# Patient Record
Sex: Female | Born: 1990 | Race: Black or African American | Hispanic: No | Marital: Single | Smoking: Never smoker
Health system: Southern US, Community
[De-identification: ages and names within clinical notes are randomized; demographics above are authoritative.]

---

## 2016-04-29 ENCOUNTER — Encounter (HOSPITAL_COMMUNITY): Payer: Self-pay

## 2016-04-29 ENCOUNTER — Emergency Department (HOSPITAL_COMMUNITY)
Admission: EM | Admit: 2016-04-29 | Discharge: 2016-04-30 | Disposition: A | Discharge status: Home / Self Care | Payer: BLUE CROSS/BLUE SHIELD | Attending: Emergency Medicine | Admitting: Emergency Medicine

## 2016-04-29 DIAGNOSIS — L02215 Cutaneous abscess of perineum: Secondary | ICD-10-CM | POA: Diagnosis present

## 2016-04-29 DIAGNOSIS — L0291 Cutaneous abscess, unspecified: Secondary | ICD-10-CM

## 2016-04-29 NOTE — ED Triage Notes (Signed)
Pt presents with an abscess on her R buttock near her anus. Denies discharge, fevers, or N/V.

## 2016-04-29 NOTE — ED Notes (Signed)
PA at bedside.

## 2016-04-29 NOTE — ED Provider Notes (Signed)
[facility]-EMERGENCY DEPT Provider Note   CSN: [id number] Arrival date & time: 04/29/16  2135 By signing my name below, I, Robin Hanson, attest that this documentation has been prepared under the direction and in the presence of non-physician practitioner, Arvilla MeresAshley Dayzha Pogosyan, PA-C  Electronically Signed: Levon HedgerElizabeth Hanson, Scribe. 04/30/2016. 12:03 AM.   History   Chief Complaint Chief Complaint  Patient presents with  . Abscess   HPI Robin Hanson is a 25 y.o. female who presents to the Emergency Department complaining of a worsening  lesion on her right buttock near her anus x 2 days. She states it is moderately painful and is increasing in size. Pain is exacerbated by movement and sitting. No alleviating factors noted. She states she has had a similar abscess before which was incised and drained in the ED.  No hx of chron's, DM, or autoimmune diseases. Pt denies any discharge or bleeding from the area,  fever, nausea, vomiting, abdominal pain, or arthralgias. Pt has no other complaints at this time.   The history is provided by the patient. No language interpreter was used.    History reviewed. No pertinent past medical history.  There are no active problems to display for this patient.   No past surgical history on file.  OB History    No data available      Home Medications    Prior to Admission medications   Medication Sig Start Date End Date Taking? Authorizing Provider  doxycycline (VIBRAMYCIN) 100 MG capsule Take 1 capsule (100 mg total) by mouth 2 (two) times daily. 04/30/16 05/05/16  Lona KettleAshley Laurel Dimitris Shanahan, PA-C   Family History No family history on file.  Social History Social History  Substance Use Topics  . Smoking status: Never Smoker  . Smokeless tobacco: Never Used  . Alcohol use Not on file     Allergies   Penicillins  Review of Systems Review of Systems  Constitutional: Negative for fever.  HENT: Negative for trouble swallowing.   Eyes: Negative for visual  disturbance.  Respiratory: Negative for shortness of breath.   Cardiovascular: Negative for chest pain.  Gastrointestinal: Negative for abdominal pain, nausea and vomiting.  Genitourinary: Negative for dysuria and hematuria.  Musculoskeletal: Negative for arthralgias and myalgias.  Skin: Positive for wound.  Allergic/Immunologic: Negative for immunocompromised state.  Neurological: Negative for headaches.   Physical Exam Updated Vital Signs BP 112/57 (BP Location: Right Arm)   Pulse 64   Temp 99 F (37.2 C) (Rectal)   Resp 14   Ht 5\' 3"  (1.6 m)   Wt 83.9 kg   SpO2 100%   BMI 32.77 kg/m   Physical Exam  Constitutional: She appears well-developed and well-nourished. No distress.  HENT:  Head: Normocephalic and atraumatic.  Mouth/Throat: Oropharynx is clear and moist. No oropharyngeal exudate.  Eyes: Conjunctivae and EOM are normal. Pupils are equal, round, and reactive to light. Right eye exhibits no discharge. Left eye exhibits no discharge. No scleral icterus.  Neck: Normal range of motion and phonation normal. Neck supple. No neck rigidity. Normal range of motion present.  Cardiovascular: Normal rate, regular rhythm, normal heart sounds and intact distal pulses.   No murmur heard. Pulmonary/Chest: Effort normal and breath sounds normal. No stridor. No respiratory distress. She has no wheezes. She has no rales.  Abdominal: Soft. Bowel sounds are normal. She exhibits no distension. There is no tenderness. There is no rigidity, no rebound, no guarding and no CVA tenderness.  Genitourinary:     Musculoskeletal: Normal range  of motion.  Lymphadenopathy:    She has no cervical adenopathy.  Neurological: She is alert. She is not disoriented. Coordination and gait normal. GCS eye subscore is 4. GCS verbal subscore is 5. GCS motor subscore is 6.  Skin: Skin is warm and dry. She is not diaphoretic.  Psychiatric: She has a normal mood and affect. Her behavior is normal.    ED  Treatments / Results  DIAGNOSTIC STUDIES:  Oxygen Saturation is 99% on RA, normal by my interpretation.    COORDINATION OF CARE:  11:55 PM Discussed treatment plan with pt at bedside and pt agreed to plan.   Labs (all labs ordered are listed, but only abnormal results are displayed) Labs Reviewed  I-STAT CHEM 8, ED - Abnormal; Notable for the following:       Result Value   Calcium, Ion 1.14 (*)    All other components within normal limits  CBC WITH DIFFERENTIAL/PLATELET  I-STAT BETA HCG BLOOD, ED ([facility], [facility], [facility] ONLY)    EKG  EKG Interpretation None      Radiology Ct Pelvis W Contrast  Result Date: 04/30/2016 CLINICAL DATA:  25 y/o F; worsening right buttocks lesion near the anus. History of prior abscess drainage. EXAM: CT PELVIS WITH CONTRAST TECHNIQUE: Multidetector CT imaging of the pelvis was performed using the standard protocol following the bolus administration of intravenous contrast. CONTRAST:  100 cc Isovue-300. COMPARISON:  None. FINDINGS: Urinary Tract:  No abnormality visualized. Bowel:  Unremarkable visualized pelvic bowel loops. Vascular/Lymphatic: No pathologically enlarged lymph nodes. No significant vascular abnormality seen. Reproductive: Normal uterus. Normal adnexa. 12 mm right para labial cyst, likely Bartholin. Other:  None. Musculoskeletal: Small fluid collection subjacent to the dermis measuring 15 x 9 x 20 mm (AP x ML x CC) centered in the right gluteal fold near the anus (series 2, image 43 and series 3, image 62). Small surrounding region of inflammatory changes. No deep extension into the anus or pelvis. IMPRESSION: Small fluid collection subjacent to the dermis centered in the right gluteal fold near the anus measuring up to 20 mm consistent with a small abscess. No deep extension into the anus or pelvis. Electronically Signed   By: Mitzi HansenLance  Furusawa-Stratton M.D.   On: 04/30/2016 03:26    Procedures .Marland Kitchen.Incision and Drainage Date/Time: 04/30/2016 1:24  PM Performed by: Lona KettleMEYER, Marilena Trevathan LAUREL Authorized by: Lona KettleMEYER, Chaquana Nichols LAUREL   Consent:    Consent obtained:  Verbal   Consent given by:  Patient   Risks discussed:  Bleeding and infection   Alternatives discussed:  No treatment Location:    Type:  Abscess   Size:  2cm   Location:  Anogenital   Anogenital location:  Perineum Pre-procedure details:    Procedure prep: alcohol. Sedation:    Sedation type: none. Anesthesia (see MAR for exact dosages):    Anesthesia method:  Local infiltration   Local anesthetic:  Lidocaine 1% WITH epi Procedure type:    Complexity:  Simple Procedure details:    Needle aspiration: no     Incision types:  Single straight   Incision depth:  Dermal   Scalpel blade:  11   Wound management:  Irrigated with saline   Drainage:  Bloody and purulent   Drainage amount:  Moderate   Wound treatment:  Wound left open   Packing materials:  None Post-procedure details:    Patient tolerance of procedure:  Tolerated well, no immediate complications   (including critical care time)  Medications Ordered in ED Medications  iopamidol (ISOVUE-300) 61 % injection 100 mL (100 mLs Intravenous Contrast Given 04/30/16 0312)  lidocaine-EPINEPHrine (XYLOCAINE W/EPI) 2 %-1:200000 (PF) injection 10 mL (10 mLs Infiltration Given 04/30/16 0505)  doxycycline (VIBRA-TABS) tablet 100 mg (100 mg Oral Given 04/30/16 1610)     Initial Impression / Assessment and Plan / ED Course  I have reviewed the triage vital signs and the nursing notes.  Pertinent labs & imaging results that were available during my care of the patient were reviewed by me and considered in my medical decision making (see chart for details).  Clinical Course  Value Comment By Time  CT Pelvis W Contrast Reviewed Lona Kettle, PA-C 11/02 0400    Patient presents to ED with complaint of boil on right buttock near anus x 2 days. Patient is afebrile and non-toxic appearing in NAD. VSS. Physical exam  remarkable for 2cm abscess on right perineum. No TTP on rectal exam. No bulge palpated on rectal exam. No s/s of systemic infection. Will check basic labs. Given location will check CT pelvis to r/o anal sphincter involvement.   Labs grossly normal. CT pelvis shows 2cm abscess in right gluteal fold without anal involvement. I&D of abscess performed. Abscess was not large enough to warrant packing or drain,  wound recheck in 2 days. Encouraged home warm soaks.  Mild signs of cellulitis is surrounding skin. Rx ABX. Establish a PCP, resources provided. Return precautions given. Patient voiced understanding and is agreeable.     Final Clinical Impressions(s) / ED Diagnoses   Final diagnoses:  Abscess    New Prescriptions Discharge Medication List as of 04/30/2016  6:17 AM    START taking these medications   Details  doxycycline (VIBRAMYCIN) 100 MG capsule Take 1 capsule (100 mg total) by mouth 2 (two) times daily., Starting Thu 04/30/2016, Until Tue 05/05/2016, Print      I personally performed the services described in this documentation, which was scribed in my presence. The recorded information has been reviewed and is accurate.    Lona Kettle, [place] 04/30/16 1332    April Palumbo, MD 04/30/16 [phone]

## 2016-04-30 ENCOUNTER — Encounter (HOSPITAL_COMMUNITY): Payer: Self-pay

## 2016-04-30 ENCOUNTER — Emergency Department (HOSPITAL_COMMUNITY): Payer: BLUE CROSS/BLUE SHIELD

## 2016-04-30 LAB — CBC WITH DIFFERENTIAL/PLATELET
BASOS ABS: 0 10*3/uL (ref 0.0–0.1)
BASOS PCT: 0 %
Eosinophils Absolute: 0.5 10*3/uL (ref 0.0–0.7)
Eosinophils Relative: 5 %
HEMATOCRIT: 41.4 % (ref 36.0–46.0)
HEMOGLOBIN: 13.9 g/dL (ref 12.0–15.0)
LYMPHS PCT: 42 %
Lymphs Abs: 4 10*3/uL (ref 0.7–4.0)
MCH: 29.3 pg (ref 26.0–34.0)
MCHC: 33.6 g/dL (ref 30.0–36.0)
MCV: 87.3 fL (ref 78.0–100.0)
Monocytes Absolute: 0.5 10*3/uL (ref 0.1–1.0)
Monocytes Relative: 5 %
NEUTROS ABS: 4.5 10*3/uL (ref 1.7–7.7)
NEUTROS PCT: 48 %
Platelets: 334 10*3/uL (ref 150–400)
RBC: 4.74 MIL/uL (ref 3.87–5.11)
RDW: 12.6 % (ref 11.5–15.5)
WBC: 9.5 10*3/uL (ref 4.0–10.5)

## 2016-04-30 LAB — I-STAT CHEM 8, ED
BUN: 10 mg/dL (ref 6–20)
CHLORIDE: 102 mmol/L (ref 101–111)
CREATININE: 0.6 mg/dL (ref 0.44–1.00)
Calcium, Ion: 1.14 mmol/L — ABNORMAL LOW (ref 1.15–1.40)
GLUCOSE: 94 mg/dL (ref 65–99)
HEMATOCRIT: 43 % (ref 36.0–46.0)
Hemoglobin: 14.6 g/dL (ref 12.0–15.0)
POTASSIUM: 3.5 mmol/L (ref 3.5–5.1)
Sodium: 139 mmol/L (ref 135–145)
TCO2: 26 mmol/L (ref 0–100)

## 2016-04-30 LAB — I-STAT BETA HCG BLOOD, ED (MC, WL, AP ONLY)

## 2016-04-30 MED ORDER — DOXYCYCLINE HYCLATE 100 MG PO CAPS: 100.0000 mg | ORAL_CAPSULE | Freq: Two times a day (BID) | ORAL | 0 refills | Status: AC

## 2016-04-30 MED ORDER — LIDOCAINE-EPINEPHRINE (PF) 2 %-1:200000 IJ SOLN
10.0000 mL | Freq: Once | INTRAMUSCULAR | Status: AC
  Administered 2016-04-30: 10 mL
  Filled 2016-04-30: qty 20

## 2016-04-30 MED ORDER — IOPAMIDOL (ISOVUE-300) INJECTION 61%
100.0000 mL | Freq: Once | INTRAVENOUS | Status: AC | PRN
  Administered 2016-04-30: 100 mL via INTRAVENOUS

## 2016-04-30 MED ORDER — DOXYCYCLINE HYCLATE 100 MG PO TABS
100.0000 mg | ORAL_TABLET | Freq: Once | ORAL | Status: AC
  Administered 2016-04-30: 100 mg via ORAL
  Filled 2016-04-30: qty 1

## 2016-04-30 NOTE — ED Notes (Signed)
Dr.Palumbo at bedside to evaluate pt.  

## 2016-04-30 NOTE — ED Notes (Signed)
Report given to Jen, RN

## 2016-04-30 NOTE — Discharge Instructions (Signed)
Read the information below.  Your labs were re-assuring. The CT did not show involvement of your rectum. The abscess was opened and drained in the ED. You are being prescribed antibiotics. Please take as directed. If you develop difficulty breathing or rash discontinue and return to ED. You can take tylenol or motrin for pain relief. You can perform warm soaks in clean water for 20-30 minutes daily.  Please follow up in the ED in 2 days for a re-evaluation of wound.  It is very important that you establish a primary provider. Please call the number in your discharge paperwork.  Use the prescribed medication as directed.  Please discuss all new medications with your pharmacist.   You may return to the Emergency Department at any time for worsening condition or any new symptoms that concern you.  Return if you develop fever, muscle aches, abdominal pain, and nausea/vomiting.

## 2016-04-30 NOTE — ED Notes (Signed)
Pt reports having pain to rectum. On exam pt has swollen area to right gluteal fold inferior to anus. Pt states pain started several days ago and has had prior hx of abscess to rectal area.

## 2016-05-06 ENCOUNTER — Encounter (HOSPITAL_COMMUNITY): Payer: Self-pay

## 2016-05-06 ENCOUNTER — Emergency Department (HOSPITAL_COMMUNITY)
Admission: EM | Admit: 2016-05-06 | Discharge: 2016-05-06 | Disposition: A | Discharge status: Home / Self Care | Payer: BLUE CROSS/BLUE SHIELD | Attending: Emergency Medicine | Admitting: Emergency Medicine

## 2016-05-06 DIAGNOSIS — G44309 Post-traumatic headache, unspecified, not intractable: Secondary | ICD-10-CM | POA: Insufficient documentation

## 2016-05-06 DIAGNOSIS — Y999 Unspecified external cause status: Secondary | ICD-10-CM | POA: Insufficient documentation

## 2016-05-06 DIAGNOSIS — W2102XA Struck by soccer ball, initial encounter: Secondary | ICD-10-CM | POA: Insufficient documentation

## 2016-05-06 DIAGNOSIS — F0781 Postconcussional syndrome: Secondary | ICD-10-CM | POA: Insufficient documentation

## 2016-05-06 DIAGNOSIS — Y9366 Activity, soccer: Secondary | ICD-10-CM | POA: Diagnosis not present

## 2016-05-06 DIAGNOSIS — R51 Headache: Secondary | ICD-10-CM | POA: Diagnosis present

## 2016-05-06 DIAGNOSIS — Y929 Unspecified place or not applicable: Secondary | ICD-10-CM | POA: Insufficient documentation

## 2016-05-06 MED ORDER — ONDANSETRON 4 MG PO TBDP: 4.0000 mg | ORAL_TABLET | Freq: Three times a day (TID) | ORAL | 0 refills | Status: AC | PRN

## 2016-05-06 MED ORDER — IBUPROFEN 400 MG PO TABS: 400.0000 mg | ORAL_TABLET | Freq: Four times a day (QID) | ORAL | 0 refills | Status: AC | PRN

## 2016-05-06 MED ORDER — ONDANSETRON 8 MG PO TBDP
8.0000 mg | ORAL_TABLET | Freq: Once | ORAL | Status: AC
  Administered 2016-05-06: 8 mg via ORAL
  Filled 2016-05-06: qty 1

## 2016-05-06 NOTE — ED Triage Notes (Signed)
Pt c/o headache r/t getting hit in the head w/ a soccer ball yesterday.  Pain score 9/10.  Pt reports taking Tylenol w/o relief.  Denies LOC.  Pt reports being stunned "for a minute."

## 2016-05-06 NOTE — ED Provider Notes (Signed)
[facility]-EMER[facility]ENCY DEPT Provider Note   CSN: [id number] Arrival date & time:[id number] 05/06/16  1718     History   Chief Complaint Chief Complaint  Patient presents with  . Head Injury  . Headache    HPI Robin Hanson is a 25 y.o. female.  Patient is 25 yo F with no significant PMH, presenting with chief complaint of dull headache to right parietal scalp after she was hit in the head with a soccer ball last evening. Pain has been constant throbbing throughout the day. Rates the pain 7/10, and tried taking Tylenol without relief. She denies any LOC, dizziness, vision changes, or difficulty ambulating. Also reports mild nausea, but no vomiting. She denies any history of concussions or other head trauma.      History reviewed. No pertinent past medical history.  There are no active problems to display for this patient.   History reviewed. No pertinent surgical history.  OB History    No data available       Home Medications    Prior to Admission medications   Medication Sig Start Date End Date Taking? Authorizing Provider  acetaminophen (TYLENOL) 500 MG tablet Take 1,000 mg by mouth every 4 (four) hours as needed (migraine).   Yes Historical Provider, MD  ibuprofen (ADVIL,MOTRIN) 400 MG tablet Take 1 tablet (400 mg total) by mouth every 6 (six) hours as needed. 05/06/16   Melody Cirrincione F de Villier II, PA  ondansetron (ZOFRAN ODT) 4 MG disintegrating tablet Take 1 tablet (4 mg total) by mouth every 8 (eight) hours as needed for nausea or vomiting. 05/06/16   Sherlene Rickel F de Villier II, PA    Family History History reviewed. No pertinent family history.  Social History Social History  Substance Use Topics  . Smoking status: Never Smoker  . Smokeless tobacco: Never Used  . Alcohol use Yes     Allergies   Penicillins   Review of Systems Review of Systems  HENT: Negative for facial swelling.   Eyes: Negative for pain and visual disturbance.  Gastrointestinal: Positive for nausea.  Negative for vomiting.  Musculoskeletal: Negative for neck pain.  Neurological: Positive for headaches. Negative for dizziness, seizures and syncope.     Physical Exam Updated Vital Signs BP 108/63 (BP Location: Left Arm)   Pulse 71   Temp 98.1 F (36.7 C) (Oral)   Resp 18   LMP 04/22/2016 Comment: negative beta HCG 04/30/16  SpO2 100%   Physical Exam  Constitutional: She is oriented to person, place, and time. She appears well-developed and well-nourished. No distress.  HENT:  Head: Normocephalic and atraumatic.  Mouth/Throat: Oropharynx is clear and moist.  No abrasion or contusion noted to head.  Eyes: Conjunctivae and EOM are normal. Pupils are equal, round, and reactive to light.  Neck: Normal range of motion. Neck supple.  No midline cervical tenderness, crepitus, or deformity. No TTP of paraspinal or trapezius musculature.  Cardiovascular: Normal rate.   Pulmonary/Chest: Effort normal. No respiratory distress.  Abdominal: Soft. There is no tenderness.  Musculoskeletal: Normal range of motion.  Neurological: She is alert and oriented to person, place, and time.  Speech is clear and goal oriented, follows commands. Cranial nerves III - XII without deficit. Normal strength in upper and lower extremities bilaterally, strong and equal grip strength. Moves extremities without ataxia, coordination intact. Normal gait.  Skin: Skin is warm and dry.  Psychiatric: She has a normal mood and affect.  Nursing note and vitals reviewed.    ED Treatments /  Results  Labs (all labs ordered are listed, but only abnormal results are displayed) Labs Reviewed - No data to display  EKG  EKG Interpretation None       Radiology No results found.  Procedures Procedures (including critical care time)  Medications Ordered in ED Medications  ondansetron (ZOFRAN-ODT) disintegrating tablet 8 mg (8 mg Oral Given 05/06/16 1819)     Initial Impression / Assessment and Plan / ED  Course  I have reviewed the triage vital signs and the nursing notes.  Pertinent labs & imaging results that were available during my care of the patient were reviewed by me and considered in my medical decision making (see chart for details).  Clinical Course    Patient is 25 yo F presenting with dull headache and mild nausea after she was hit in the head with a soccer ball last evening. Completely normal neuro exam. Symptoms likely post concussive syndrome. Given Zofran ODT which improved nausea. Stable for d/c home with prescription Zofran and ibuprofen. Advised to avoid contact sports until cleared by physician, note for school provided, and [facility]. Return precautions discussed for headache, dizziness, vision changes, vomiting, or any concerning neurologic symptoms.  Final Clinical Impressions(s) / ED Diagnoses   Final diagnoses:  Post concussion syndrome    New Prescriptions Discharge Medication List as of 05/06/2016  6:56 PM    START taking these medications   Details  ibuprofen (ADVIL,MOTRIN) 400 MG tablet Take 1 tablet (400 mg total) by mouth every 6 (six) hours as needed., Starting Wed 05/06/2016, Print    ondansetron (ZOFRAN ODT) 4 MG disintegrating tablet Take 1 tablet (4 mg total) by mouth every 8 (eight) hours as needed for nausea or vomiting., Starting Wed 05/06/2016, Print         Ivonne AndrewDaryl F de VaughnVillier II, [place] 05/06/16 2115    Azalia BilisKevin Campos, MD 05/09/16 [id number]

## 2016-05-06 NOTE — Discharge Instructions (Signed)
Your mild headache and nausea are symptoms of post-concussive syndrome. Please take ibuprofen and Zofran as directed for relief of your headache and nausea. [facility], and do not participate in any contact sports until cleared [facility].

## 2017-12-08 IMAGING — CT CT PELVIS W/ CM
2 of 3 series · 16 of 46 positions shown, 18 images · IV contrast (agent unspecified)
Comparison: None.

CLINICAL DATA: 25 y/o F; worsening right buttocks lesion near the
anus. History of prior abscess drainage.

EXAM:
CT PELVIS WITH CONTRAST
TECHNIQUE: Multidetector CT imaging of the pelvis was performed using the
standard protocol following the bolus administration of intravenous
contrast.
CONTRAST:  100 cc Ysovue-BXX.

[Series 2: pelvis with · axial · 0.85mm/px · z∈[+995,+1245]mm · 13 of 58 slices shown, 15 images]
[im 4/58  soft-tissue]
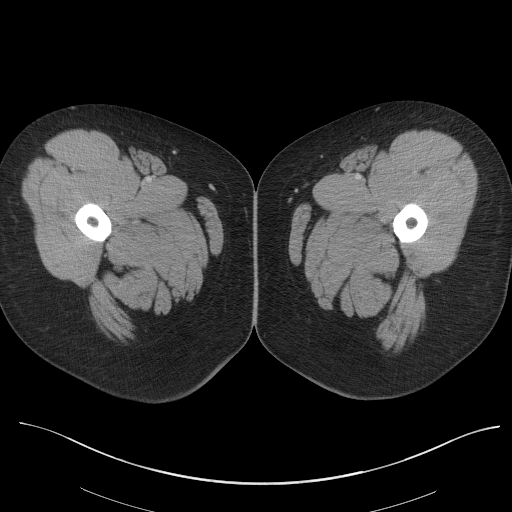
[im 4/58  bone]
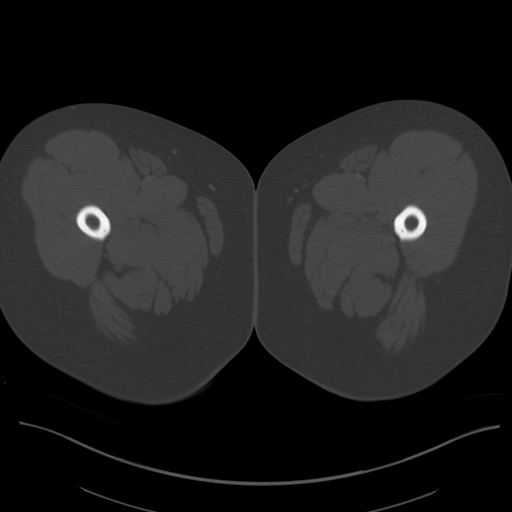
[im 8/58  soft-tissue]
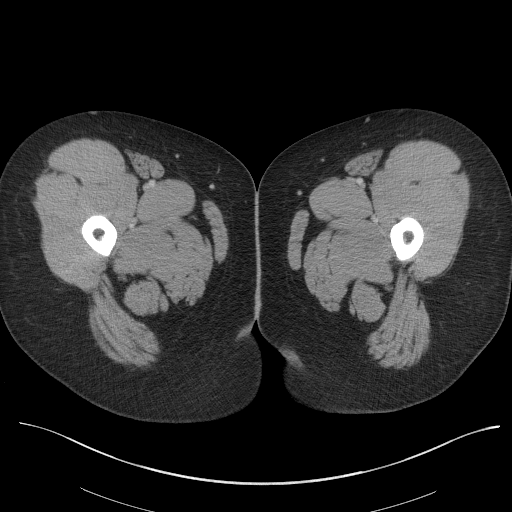
[im 12/58  soft-tissue]
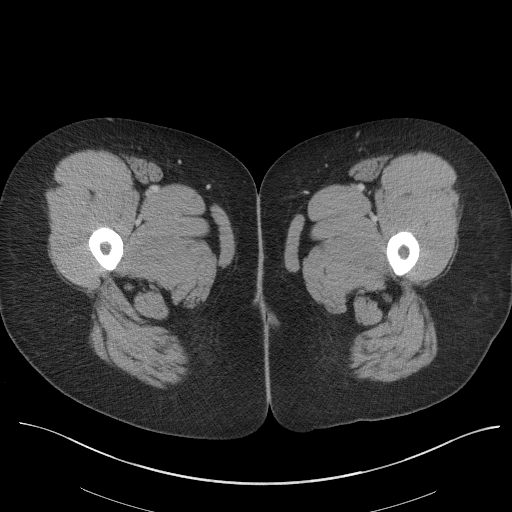
[im 17/58  soft-tissue]
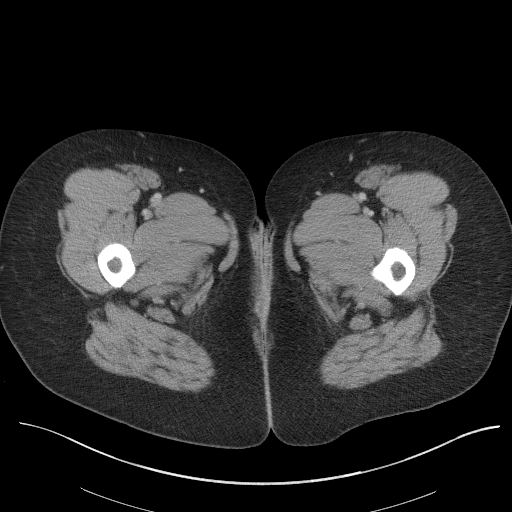
[im 21/58  soft-tissue]
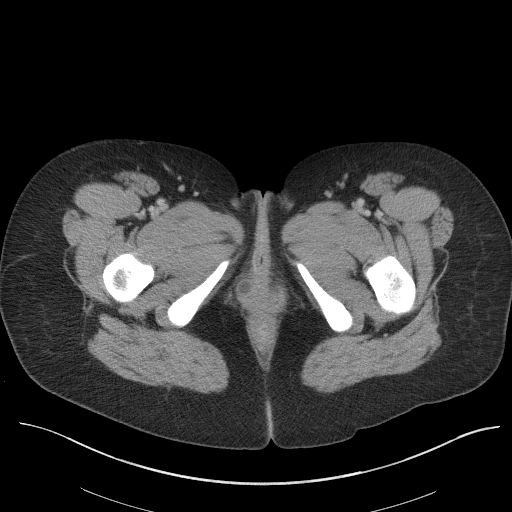
[im 24/58  soft-tissue]
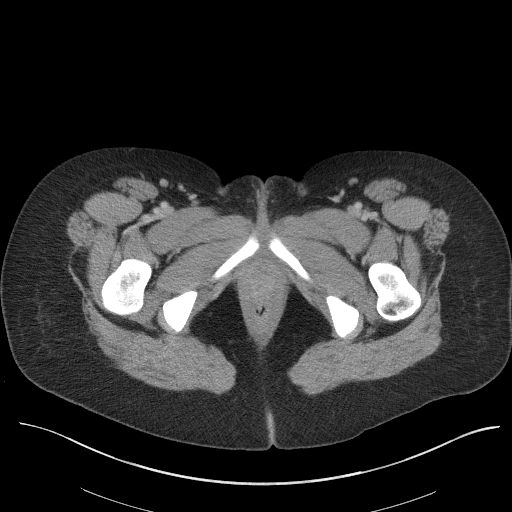
[im 30/58  soft-tissue]
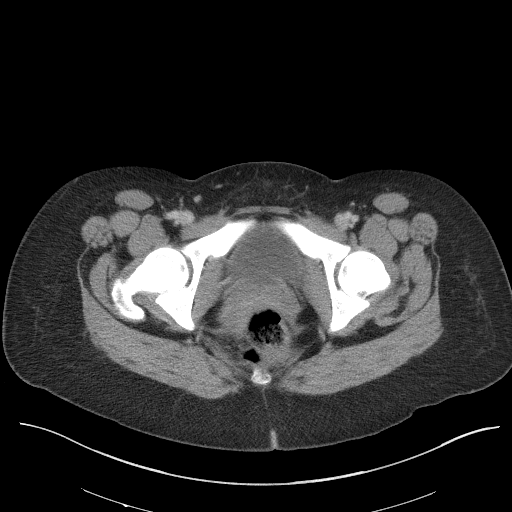
[im 34/58  soft-tissue]
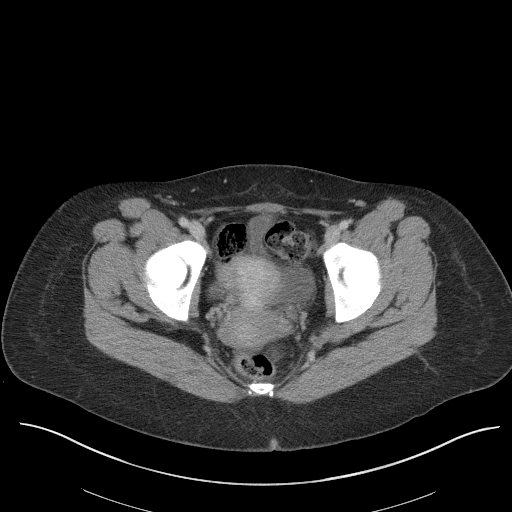
[im 37/58  soft-tissue]
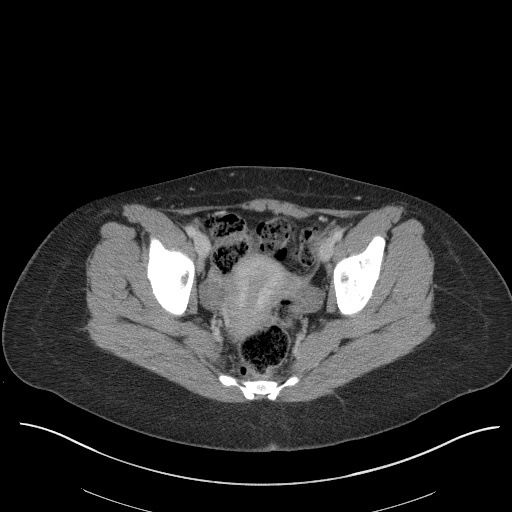
[im 37/58  bone]
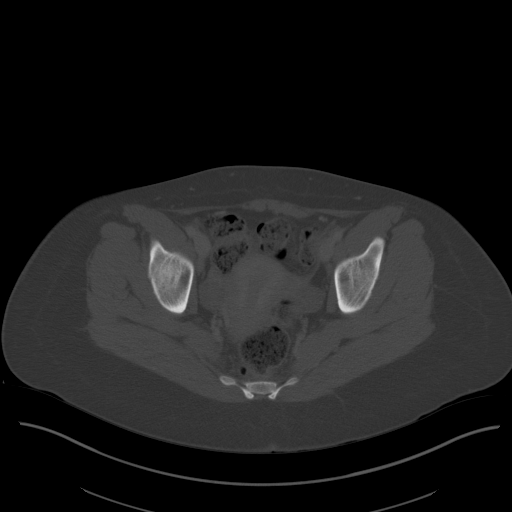
[im 41/58  soft-tissue]
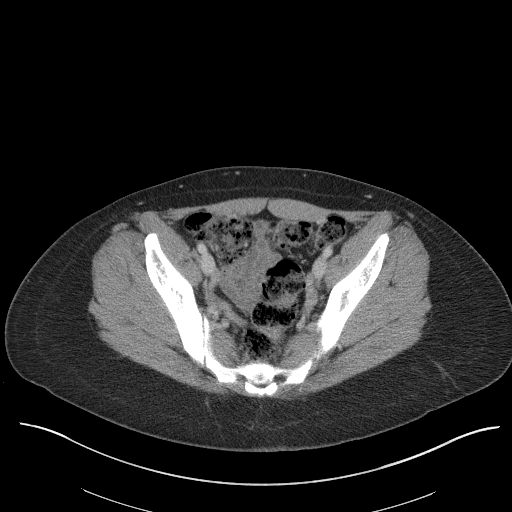
[im 46/58  soft-tissue]
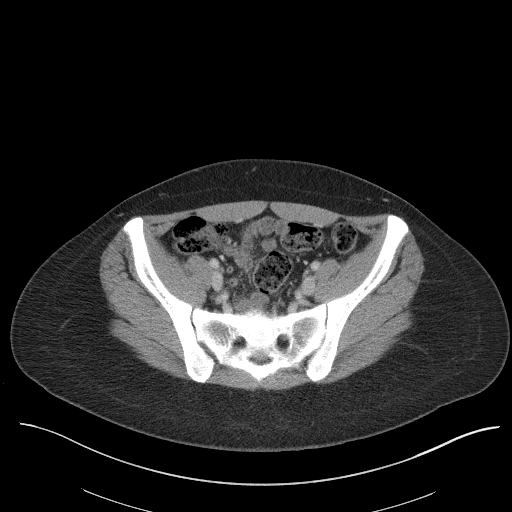
[im 50/58  soft-tissue]
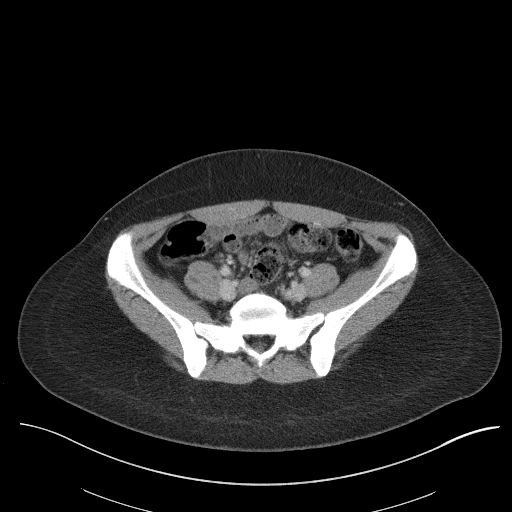
[im 54/58  soft-tissue]
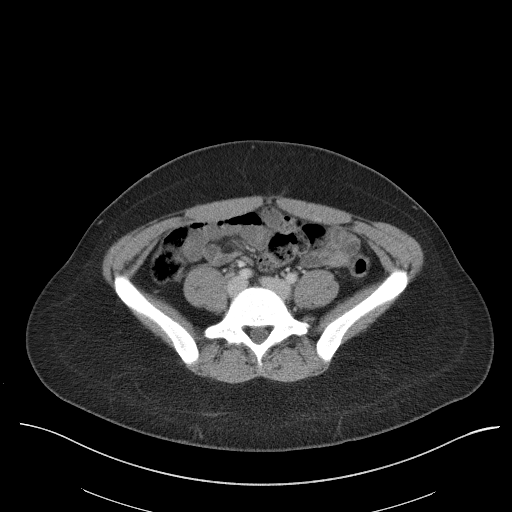

[Series 3: coronal images · coronal · 0.56mm/px · 3 of 121 slices shown]
[im 41/121  soft-tissue]
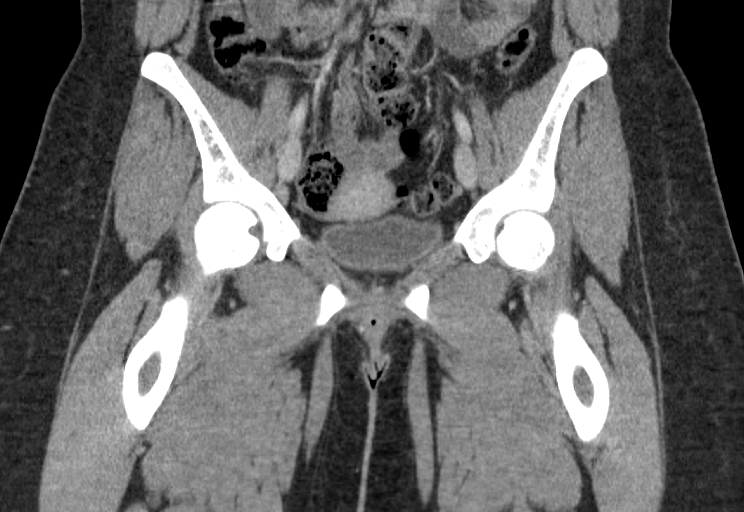
[im 54/121  soft-tissue]
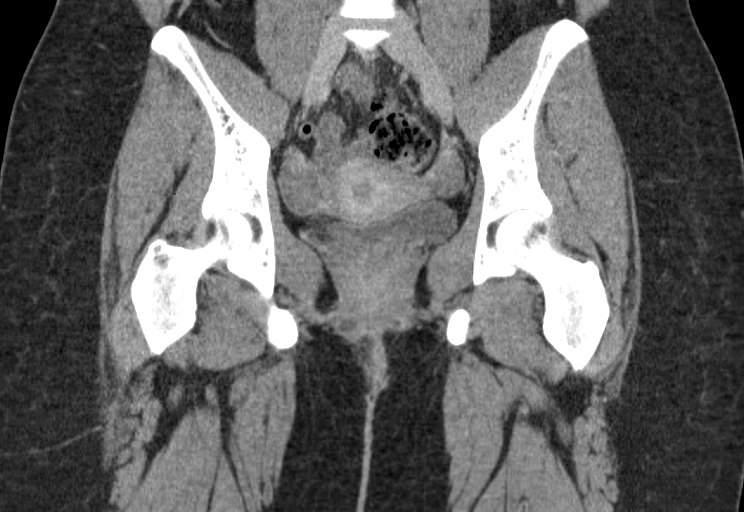
[im 67/121  soft-tissue]
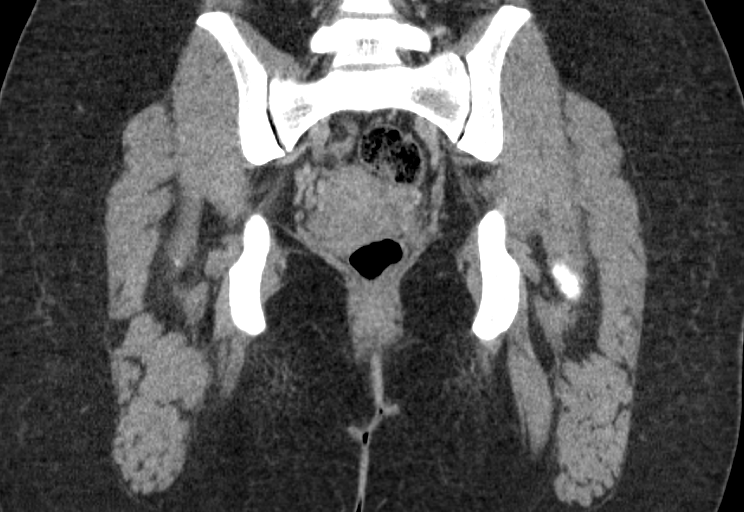

[16 of 46 positions shown; findings below may reference images not displayed]

FINDINGS: Urinary Tract:  No abnormality visualized.

Bowel:  Unremarkable visualized pelvic bowel loops.

Vascular/Lymphatic: No pathologically enlarged lymph nodes. No
significant vascular abnormality seen.

Reproductive: Normal uterus. Normal adnexa. 12 mm right para labial
cyst, likely Bartholin.

Other:  None.

Musculoskeletal: Small fluid collection subjacent to the dermis
measuring 15 x 9 x 20 mm (AP x ML x CC) centered in the right
gluteal fold near the anus (series 2, image 43 and series 3, image
62). Small surrounding region of inflammatory changes. No deep
extension into the anus or pelvis.
IMPRESSION: Small fluid collection subjacent to the dermis centered in the right
gluteal fold near the anus measuring up to 20 mm consistent with a
small abscess. No deep extension into the anus or pelvis.

By: Edgardas Osinas M.D.

## 2021-09-09 ENCOUNTER — Other Ambulatory Visit: Payer: Self-pay | Admitting: Advanced Practice Midwife

## 2021-09-09 DIAGNOSIS — N939 Abnormal uterine and vaginal bleeding, unspecified: Secondary | ICD-10-CM

## 2021-09-24 ENCOUNTER — Ambulatory Visit
Admission: RE | Admit: 2021-09-24 | Discharge: 2021-09-24 | Disposition: A | Discharge status: Home / Self Care | Payer: BC Managed Care – PPO | Source: Ambulatory Visit | Attending: Advanced Practice Midwife | Admitting: Advanced Practice Midwife

## 2021-09-24 DIAGNOSIS — N939 Abnormal uterine and vaginal bleeding, unspecified: Secondary | ICD-10-CM

## 2022-03-07 ENCOUNTER — Ambulatory Visit
Admission: EM | Admit: 2022-03-07 | Discharge: 2022-03-07 | Disposition: A | Discharge status: Home / Self Care | Payer: BC Managed Care – PPO

## 2022-03-07 DIAGNOSIS — H6592 Unspecified nonsuppurative otitis media, left ear: Secondary | ICD-10-CM

## 2022-03-07 DIAGNOSIS — H6122 Impacted cerumen, left ear: Secondary | ICD-10-CM

## 2022-03-07 MED ORDER — FLUTICASONE PROPIONATE 50 MCG/ACT NA SUSP: 1.0000 | Freq: Every day | NASAL | 0 refills | Status: DC

## 2022-03-07 MED ORDER — CETIRIZINE HCL 10 MG PO TABS: 10.0000 mg | ORAL_TABLET | Freq: Every day | ORAL | 0 refills | Status: AC

## 2022-03-07 NOTE — ED Provider Notes (Signed)
[facility]    CSN: [id number] Arrival date & time: 03/07/22  0816      History   Chief Complaint Chief Complaint  Patient presents with   Otalgia    HPI Margret Espino is a 31 y.o. female.   Patient presents with left ear discomfort that started yesterday.  Patient reports there is a feeling of "ear fullness" and decreased hearing.  Denies any pain to the ear.  Denies trauma, body, associated upper respiratory symptoms.  Patient reports that they used hydrogen peroxide with no improvement.   Otalgia   History reviewed. No pertinent past medical history.  There are no problems to display for this patient.   History reviewed. No pertinent surgical history.  OB History   No obstetric history on file.      Home Medications    Prior to Admission medications   Medication Sig Start Date End Date Taking? Authorizing Provider  cetirizine (ZYRTEC) 10 MG tablet Take 1 tablet (10 mg total) by mouth daily. 03/07/22  Yes Blane Worthington, Acie Fredrickson, FNP  fluticasone (FLONASE) 50 MCG/ACT nasal spray Place 1 spray into both nostrils daily for 3 days. 03/07/22 03/10/22 Yes Tonae Livolsi, Acie Fredrickson, FNP  acetaminophen (TYLENOL) 500 MG tablet Take 1,000 mg by mouth every 4 (four) hours as needed (migraine).    [provider]  ibuprofen (ADVIL,MOTRIN) 400 MG tablet Take 1 tablet (400 mg total) by mouth every 6 (six) hours as needed. 05/06/16   de Villier, Daryl F II, PA  ondansetron (ZOFRAN ODT) 4 MG disintegrating tablet Take 1 tablet (4 mg total) by mouth every 8 (eight) hours as needed for nausea or vomiting. 05/06/16   de Villier, Daryl F II, PA  testosterone cypionate (DEPOTESTOSTERONE CYPIONATE) 200 MG/ML injection SMARTSIG:0.4 Milliliter(s) SUB-Q Once a Week 01/14/22   [provider]    Family History History reviewed. No pertinent family history.  Social History Social History   Tobacco Use   Smoking status: Never   Smokeless tobacco: Never  Substance Use Topics    Alcohol use: Yes   Drug use: No     Allergies   Penicillins   Review of Systems Review of Systems Per HPI  Physical Exam Triage Vital Signs ED Triage Vitals  Enc Vitals Group     BP 03/07/22 0828 121/79     Pulse Rate 03/07/22 0828 60     Resp 03/07/22 0828 16     Temp 03/07/22 0828 98 F (36.7 C)     Temp Source 03/07/22 0828 Oral     SpO2 03/07/22 0828 97 %     Weight --      Height --      Head Circumference --      Peak Flow --      Pain Score 03/07/22 0829 0     Pain Loc --      Pain Edu? --      Excl. in GC? --    No data found.  Updated Vital Signs BP 121/79 (BP Location: Left Arm)   Pulse 60   Temp 98 F (36.7 C) (Oral)   Resp 16   SpO2 97%   Visual Acuity Right Eye Distance:   Left Eye Distance:   Bilateral Distance:    Right Eye Near:   Left Eye Near:    Bilateral Near:     Physical Exam Constitutional:      General: She is not in acute distress.    Appearance: Normal appearance.  She is not toxic-appearing or diaphoretic.  HENT:     Head: Normocephalic and atraumatic.     Left Ear: No drainage, swelling or tenderness. A middle ear effusion is present. There is impacted cerumen. Tympanic membrane is not perforated, erythematous or bulging.     Ears:     Comments: Impacted cerumen to left external canal on original exam.  Ear irrigation was completed to left ear with successful removal of cerumen.  TM appears intact on second physical exam and is not erythematous.  External canal is normal.  There is fluid behind the TM on second physical exam. Eyes:     Extraocular Movements: Extraocular movements intact.     Conjunctiva/sclera: Conjunctivae normal.  Pulmonary:     Effort: Pulmonary effort is normal.  Neurological:     General: No focal deficit present.     Mental Status: She is alert and oriented to person, place, and time. Mental status is at baseline.  Psychiatric:        Mood and Affect: Mood normal.        Behavior: Behavior  normal.        Thought Content: Thought content normal.        Judgment: Judgment normal.      UC Treatments / Results  Labs (all labs ordered are listed, but only abnormal results are displayed) Labs Reviewed - No data to display  EKG   Radiology No results found.  Procedures Procedures (including critical care time)  Medications Ordered in UC Medications - No data to display  Initial Impression / Assessment and Plan / UC Course  I have reviewed the triage vital signs and the nursing notes.  Pertinent labs & imaging results that were available during my care of the patient were reviewed by me and considered in my medical decision making (see chart for details).     Patient had left impacted cerumen.  Ear irrigation was completed to left ear with successful removal of cerumen.  No signs of infection on second physical exam but there was middle ear effusion to left ear.  Will treat with antihistamine and Flonase.  Advised patient to follow-up if any symptoms persist or worsen.  Patient verbalized understanding and was agreeable with plan. Final Clinical Impressions(s) / UC Diagnoses   Final diagnoses:  Impacted cerumen of left ear  Fluid level behind tympanic membrane of left ear     Discharge Instructions      Your has been washed out successfully.  It appears that you have fluid behind your eardrum which is being treated with Zyrtec and Flonase.  Please try to use Flonase for only 3 days but you may use Zyrtec for 7 to 10 days.  Follow-up if any symptoms persist or worsen.     ED Prescriptions     Medication Sig Dispense Auth. Provider   cetirizine (ZYRTEC) 10 MG tablet Take 1 tablet (10 mg total) by mouth daily. 30 tablet [place], [place], [place]   fluticasone [facility]) 50 MCG/ACT nasal spray Place 1 spray into both nostrils daily for 3 days. 16 g Gustavus Bryant, [place]      PDMP not reviewed this encounter.   Gustavus Bryant, [place] 03/07/22 [phone]

## 2022-03-07 NOTE — ED Triage Notes (Signed)
Pt c/o otalgia in left ear onset ~ yesterday. Tried hydrogen peroxide at home without relief. Denies pain.

## 2022-03-07 NOTE — Discharge Instructions (Signed)
Your has been washed out successfully.  It appears that you have fluid behind your eardrum which is being treated with Zyrtec and Flonase.  Please try to use Flonase for only 3 days but you may use Zyrtec for 7 to 10 days.  Follow-up if any symptoms persist or worsen.

## 2023-05-04 IMAGING — US US PELVIS COMPLETE WITH TRANSVAGINAL
1 series · 13 of 25 positions shown · non-contrast
Comparison: Prior CT from 04/30/2016.

CLINICAL DATA: Initial evaluation for abnormal uterine bleeding.

EXAM:
TRANSABDOMINAL AND TRANSVAGINAL ULTRASOUND OF PELVIS
TECHNIQUE: Both transabdominal and transvaginal ultrasound examinations of the
pelvis were performed. Transabdominal technique was performed for
global imaging of the pelvis including uterus, ovaries, adnexal
regions, and pelvic cul-de-sac. It was necessary to proceed with
endovaginal exam following the transabdominal exam to visualize the
endometrium and ovaries.

[Series 1: us pelvis complete with transvaginal · 0.22mm/px · 13 of 47 slices shown]
[im 1/47]
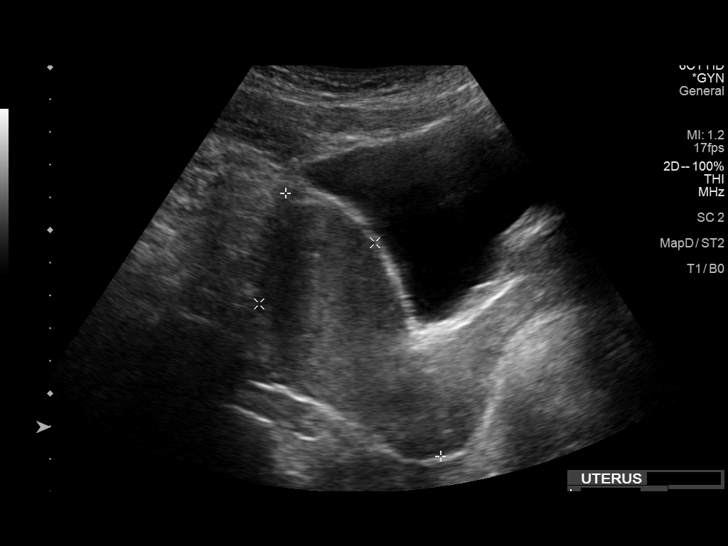
[im 4/47]
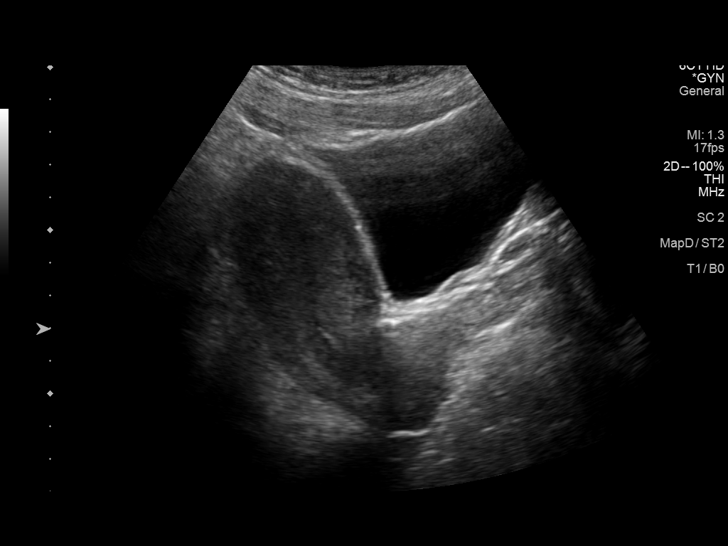
[im 8/47]
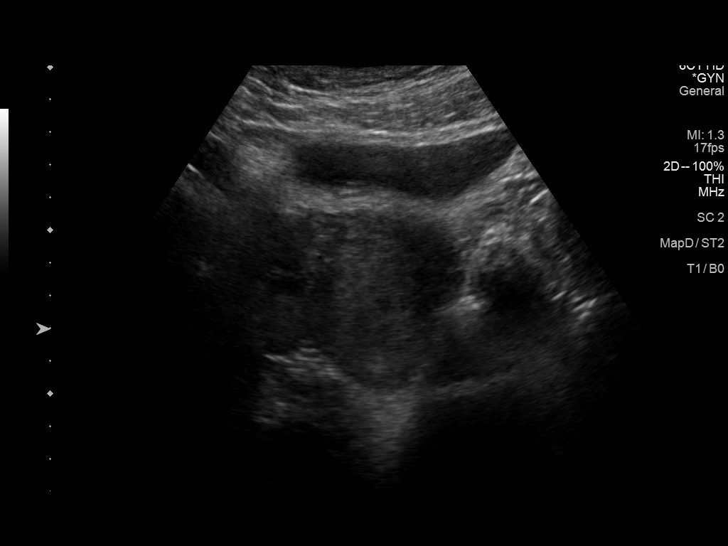
[im 12/47]
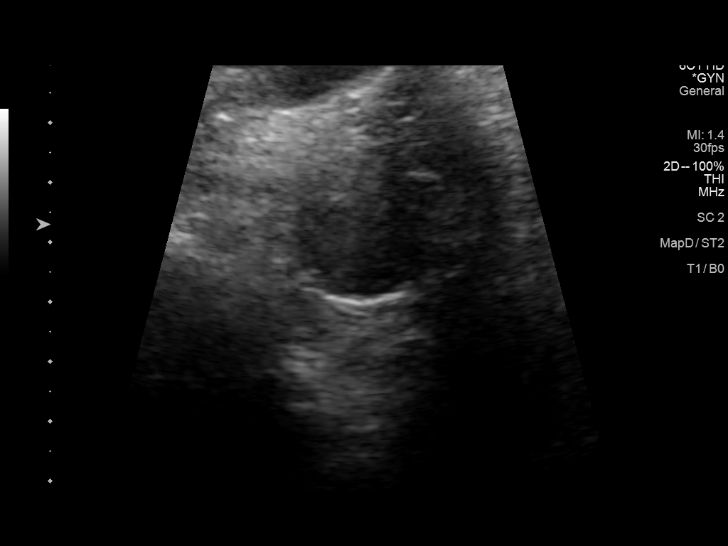
[im 16/47]
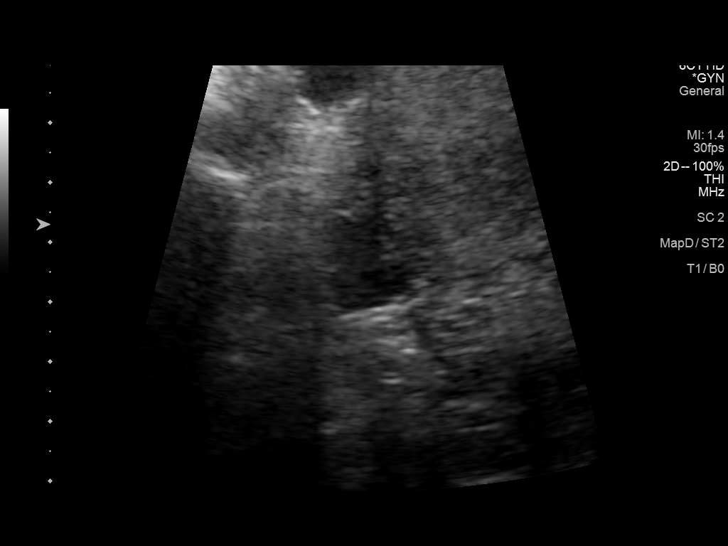
[im 20/47]
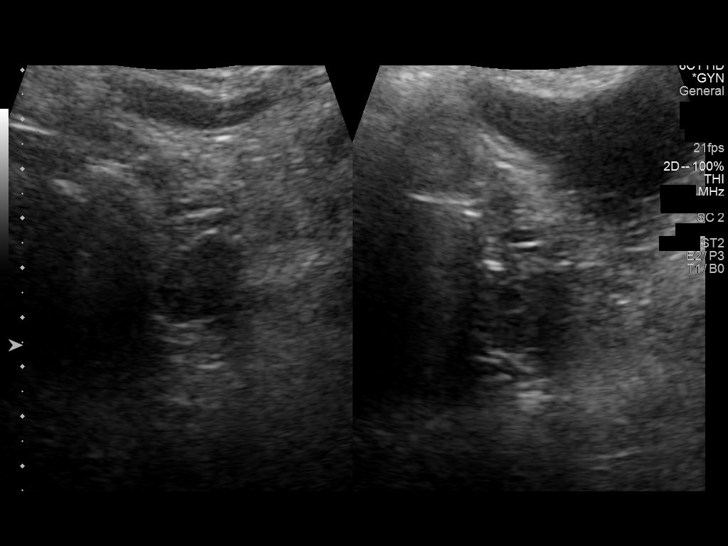
[im 24/47]
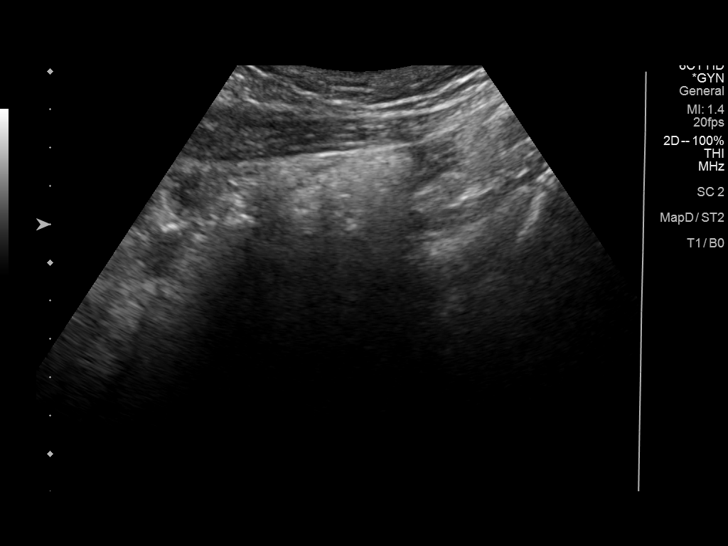
[im 27/47]
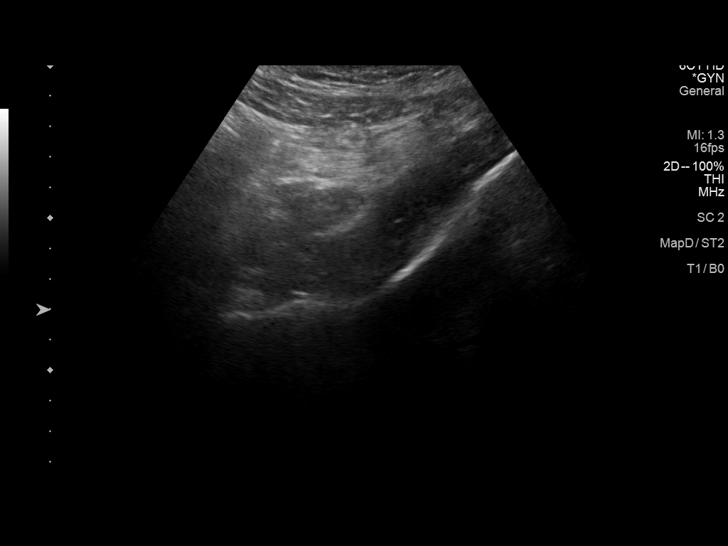
[im 31/47]
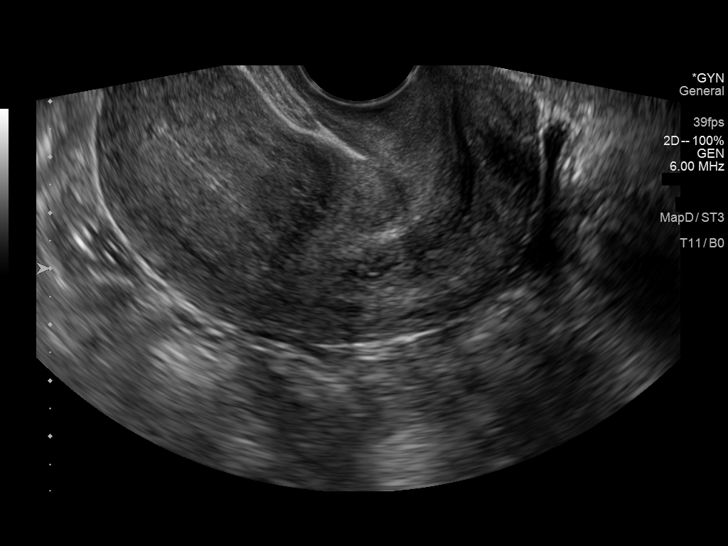
[im 35/47]
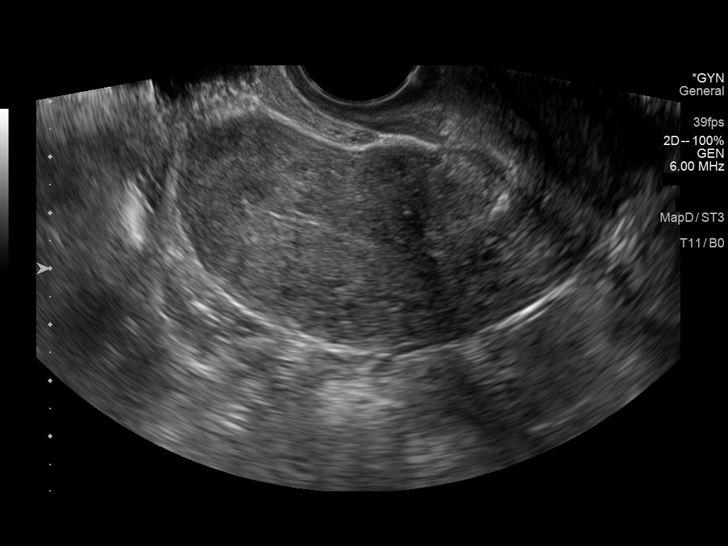
[im 39/47]
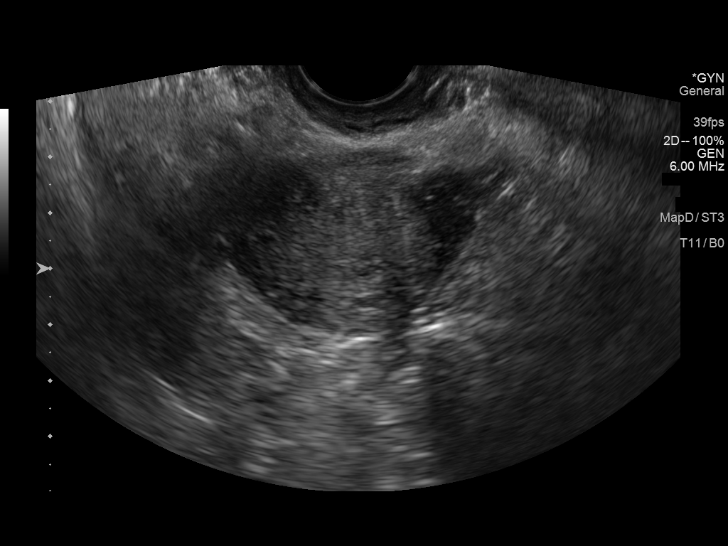
[im 43/47]
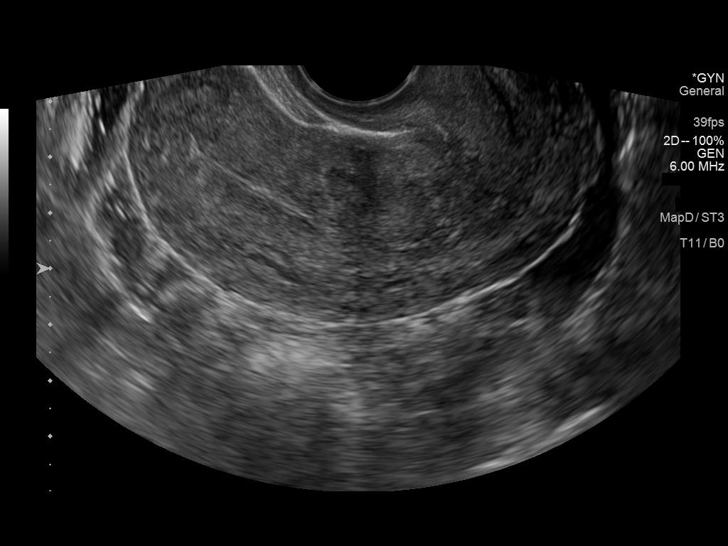
[im 47/47]
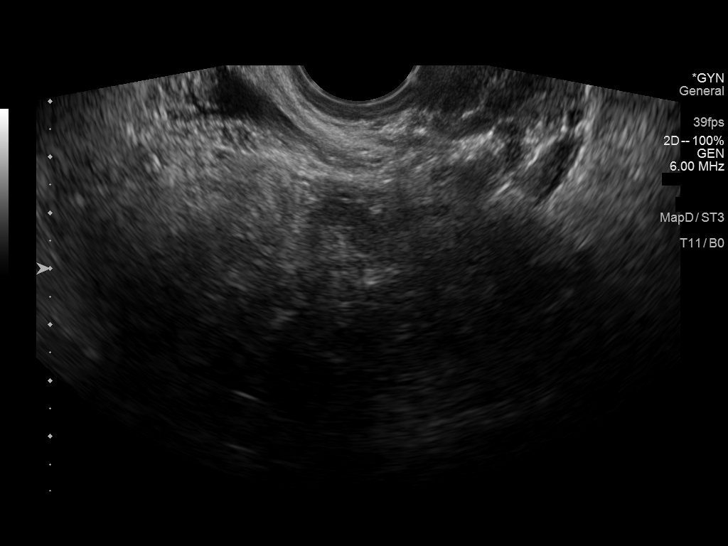

[13 of 25 positions shown; findings below may reference images not displayed]

FINDINGS: Uterus

Measurements: 9.3 x 4.0 x 5.5 cm = volume: 108.0 mL. Uterus is
anteverted. No discrete fibroid or other myometrial abnormality.

Endometrium

Thickness: 1.4 mm.  No focal abnormality visualized.

Right ovary

Measurements: 1.8 x 1.2 x 1.2 cm = volume: 1.3 mL. Normal
appearance/no adnexal mass.

Left ovary

Measurements: 2.0 x 1.4 x 1.6 cm = volume: 2.3 mL. Normal
appearance/no adnexal mass.

Other findings

Trace free fluid present within the pelvic cul-de-sac.
IMPRESSION: 1. Normal pelvic ultrasound.
2. Endometrial stripe is thin measuring 1.4 mm in thickness. If
bleeding remains unresponsive to hormonal or medical therapy,
sonohysterogram should be considered for focal lesion work-up. (Ref:
Radiological Reasoning: Algorithmic Workup of Abnormal Vaginal
Bleeding with Endovaginal Sonography and Sonohysterography. AJR
9556; 191:S68-73).

## 2024-08-01 ENCOUNTER — Other Ambulatory Visit: Payer: Self-pay

## 2024-08-01 ENCOUNTER — Ambulatory Visit
Admission: EM | Admit: 2024-08-01 | Discharge: 2024-08-01 | Disposition: A | Discharge status: Home / Self Care | Source: Home / Self Care

## 2024-08-01 ENCOUNTER — Encounter: Payer: Self-pay | Admitting: Emergency Medicine

## 2024-08-01 DIAGNOSIS — H6992 Unspecified Eustachian tube disorder, left ear: Secondary | ICD-10-CM

## 2024-08-01 MED ORDER — PSEUDOEPHEDRINE HCL 30 MG PO TABS: 30.0000 mg | ORAL_TABLET | ORAL | 0 refills | Status: AC | PRN

## 2024-08-01 MED ORDER — FLUTICASONE PROPIONATE 50 MCG/ACT NA SUSP: 1.0000 | Freq: Every day | NASAL | 0 refills | Status: AC

## 2024-08-01 MED ORDER — PREDNISONE 50 MG PO TABS: ORAL_TABLET | ORAL | 0 refills | Status: AC

## 2024-08-01 NOTE — ED Provider Notes (Signed)
 " [facility]    CSN: [id number] Arrival date & time: 08/01/24  1339      History   Chief Complaint Chief Complaint  Patient presents with   Ear Fullness    HPI Robin Hanson is a 34 y.o. female.   Pt presents today due to 3 days worth of fullness in left ear after recovering from sinus infection. Pt states that ear feels like it needs to pop and sometime he can hear ringing. Pt states that he has been using OTC cold meds for symptoms with no relief.   The history is provided by the patient.  Ear Fullness    History reviewed. No pertinent past medical history.  There are no active problems to display for this patient.   History reviewed. No pertinent surgical history.  OB History   No obstetric history on file.      Home Medications    Prior to Admission medications  Medication Sig Start Date End Date Taking? Authorizing Provider  predniSONE  (DELTASONE ) 50 MG tablet Take 1 tab po daily for 5 days 08/01/24  Yes Andra Corean BROCKS, PA-C  pseudoephedrine  (SUDAFED) 30 MG tablet Take 1 tablet (30 mg total) by mouth every 4 (four) hours as needed for congestion. 08/01/24  Yes Andra Corean BROCKS, PA-C  acetaminophen (TYLENOL) 500 MG tablet Take 1,000 mg by mouth every 4 (four) hours as needed (migraine).    [provider]  cetirizine  (ZYRTEC ) 10 MG tablet Take 1 tablet (10 mg total) by mouth daily. 03/07/22   Hazen Darryle BRAVO, FNP  fluticasone  (FLONASE ) 50 MCG/ACT nasal spray Place 1 spray into both nostrils daily. 08/01/24   Andra Corean BROCKS, PA-C  ibuprofen  (ADVIL ,MOTRIN ) 400 MG tablet Take 1 tablet (400 mg total) by mouth every 6 (six) hours as needed. 05/06/16   de Villier, Daryl F II, PA  ondansetron  (ZOFRAN  ODT) 4 MG disintegrating tablet Take 1 tablet (4 mg total) by mouth every 8 (eight) hours as needed for nausea or vomiting. 05/06/16   de Villier, Daryl F II, PA  testosterone cypionate (DEPOTESTOSTERONE CYPIONATE) 200 MG/ML injection  SMARTSIG:0.4 Milliliter(s) SUB-Q Once a Week 01/14/22   [provider]    Family History History reviewed. No pertinent family history.  Social History Social History[1]   Allergies   Penicillins   Review of Systems Review of Systems   Physical Exam Triage Vital Signs ED Triage Vitals [08/01/24 1443]  Encounter Vitals Group     BP 121/65     Girls Systolic BP Percentile      Girls Diastolic BP Percentile      Boys Systolic BP Percentile      Boys Diastolic BP Percentile      Pulse Rate 88     Resp 18     Temp 97.9 F (36.6 C)     Temp Source Oral     SpO2 98 %     Weight      Height      Head Circumference      Peak Flow      Pain Score 0     Pain Loc      Pain Education      Exclude from Growth Chart    No data found.  Updated Vital Signs BP 121/65 (BP Location: Left Arm)   Pulse 88   Temp 97.9 F (36.6 C) (Oral)   Resp 18   SpO2 98%   Visual Acuity Right Eye Distance:   Left  Eye Distance:   Bilateral Distance:    Right Eye Near:   Left Eye Near:    Bilateral Near:     Physical Exam Vitals and nursing note reviewed.  Constitutional:      General: She is not in acute distress.    Appearance: Normal appearance. She is not ill-appearing, toxic-appearing or diaphoretic.  HENT:     Right Ear: Tympanic membrane, ear canal and external ear normal.     Left Ear: Tympanic membrane, ear canal and external ear normal.     Nose: Congestion (moderately enlarged turbinates) present. No rhinorrhea.     Mouth/Throat:     Mouth: Mucous membranes are moist.     Pharynx: Oropharynx is clear. No oropharyngeal exudate or posterior oropharyngeal erythema.  Eyes:     General: No scleral icterus. Cardiovascular:     Rate and Rhythm: Normal rate and regular rhythm.     Heart sounds: Normal heart sounds.  Pulmonary:     Effort: Pulmonary effort is normal. No respiratory distress.     Breath sounds: Normal breath sounds. No wheezing or rhonchi.  Skin:     General: Skin is warm.  Neurological:     Mental Status: She is alert and oriented to person, place, and time.  Psychiatric:        Mood and Affect: Mood normal.        Behavior: Behavior normal.      UC Treatments / Results  Labs (all labs ordered are listed, but only abnormal results are displayed) Labs Reviewed - No data to display  EKG   Radiology No results found.  Procedures Procedures (including critical care time)  Medications Ordered in UC Medications - No data to display  Initial Impression / Assessment and Plan / UC Course  I have reviewed the triage vital signs and the nursing notes.  Pertinent labs & imaging results that were available during my care of the patient were reviewed by me and considered in my medical decision making (see chart for details).     Final Clinical Impressions(s) / UC Diagnoses   Final diagnoses:  Eustachian tube disorder, left   Discharge Instructions   None    ED Prescriptions     Medication Sig Dispense Auth. Provider   fluticasone  (FLONASE ) 50 MCG/ACT nasal spray Place 1 spray into both nostrils daily. 16 g Money Mckeithan C, PA-C   pseudoephedrine  (SUDAFED) 30 MG tablet Take 1 tablet (30 mg total) by mouth every 4 (four) hours as needed for congestion. 30 tablet Ameerah Huffstetler C, PA-C   predniSONE  (DELTASONE ) 50 MG tablet Take 1 tab po daily for 5 days 5 tablet Andra Corean BROCKS, PA-C      PDMP not reviewed this encounter.    [1]  Social History Tobacco Use   Smoking status: Never   Smokeless tobacco: Never  Substance Use Topics   Alcohol use: Yes   Drug use: No     Andra Corean BROCKS, PA-C 08/01/24 1547  "
